# Patient Record
Sex: Male | Born: 1985
Health system: Southern US, Community
[De-identification: ages and names within clinical notes are randomized; demographics above are authoritative.]

## PROBLEM LIST (undated history)

## (undated) DIAGNOSIS — F32A Depression, unspecified: Secondary | ICD-10-CM

## (undated) DIAGNOSIS — F329 Major depressive disorder, single episode, unspecified: Secondary | ICD-10-CM

## (undated) DIAGNOSIS — F419 Anxiety disorder, unspecified: Secondary | ICD-10-CM

## (undated) HISTORY — DX: Anxiety disorder, unspecified: F41.9

## (undated) HISTORY — PX: HAND SURGERY: SHX662

## (undated) HISTORY — DX: Major depressive disorder, single episode, unspecified: F32.9

## (undated) HISTORY — DX: Depression, unspecified: F32.A

---

## 2011-12-29 ENCOUNTER — Encounter (HOSPITAL_COMMUNITY): Payer: Self-pay | Admitting: Emergency Medicine

## 2011-12-29 ENCOUNTER — Emergency Department (HOSPITAL_COMMUNITY)
Admission: EM | Admit: 2011-12-29 | Discharge: 2011-12-29 | Disposition: A | Discharge status: Home / Self Care | Payer: Self-pay | Attending: Emergency Medicine | Admitting: Emergency Medicine

## 2011-12-29 ENCOUNTER — Emergency Department (HOSPITAL_COMMUNITY): Payer: Self-pay

## 2011-12-29 DIAGNOSIS — M542 Cervicalgia: Secondary | ICD-10-CM | POA: Insufficient documentation

## 2011-12-29 DIAGNOSIS — M25519 Pain in unspecified shoulder: Secondary | ICD-10-CM | POA: Insufficient documentation

## 2011-12-29 MED ORDER — HYDROCODONE-ACETAMINOPHEN 5-500 MG PO TABS: 1.0000 | ORAL_TABLET | Freq: Four times a day (QID) | ORAL | Status: AC | PRN

## 2011-12-29 MED ORDER — IBUPROFEN 200 MG PO TABS
600.0000 mg | ORAL_TABLET | Freq: Once | ORAL | Status: AC
  Administered 2011-12-29: 600 mg via ORAL
  Filled 2011-12-29: qty 3

## 2011-12-29 MED ORDER — PREDNISONE 20 MG PO TABS: ORAL_TABLET | ORAL | Status: AC

## 2011-12-29 NOTE — ED Notes (Signed)
Pt c/o left shoulder pain pt denies any injury.  Strong radial pulse present

## 2011-12-29 NOTE — ED Provider Notes (Signed)
History     CSN: [id number]  Arrival date & time 12/29/11  1615   First MD Initiated Contact with Patient 12/29/11 1711      Chief Complaint  Patient presents with  . Shoulder Pain    (Consider location/radiation/quality/duration/timing/severity/associated sxs/prior treatment) Patient is a 26 y.o. male presenting with shoulder pain. The history is provided by the patient.  Shoulder Pain  pt c/o left neck, left trapezius area, and left shoulder pain for the past month. Constant. Worse w movement shoulder and turning neck 'all the way to left'. Occasionally radiates down to left upper arm. No numbness/weakness. No hx ddd. No injury to neck or shoulder. No hx djd, or rotator cuff injury. No fall. 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 No fever or chills.  History reviewed. No pertinent past medical history.  Past Surgical History  Procedure Date  . Hand surgery     No family history on file.  History  Substance Use Topics  . Smoking status: Current Everyday Smoker  . Smokeless tobacco: Not on file  . Alcohol Use: No      Review of Systems  Constitutional: Negative for fever and chills.  Musculoskeletal: Negative for joint swelling.  Neurological: Negative for weakness and numbness.    Allergies  Review of patient's allergies indicates no known allergies.  Home Medications  No current outpatient prescriptions on file.  BP 123/74  Pulse 64  Temp(Src) 98 F (36.7 C) (Oral)  Resp 18  SpO2 99%  Physical Exam  Nursing note and vitals reviewed. Constitutional: He is oriented to person, place, and time. He appears well-developed and well-nourished. No distress.  HENT:  Head: Atraumatic.  Eyes: Pupils are equal, round, and reactive to light.  Neck: Normal range of motion. Neck supple. No tracheal deviation present.  Cardiovascular: Normal rate.   Pulmonary/Chest: Effort normal. No accessory muscle usage. No respiratory  distress.  Musculoskeletal: Normal range of motion. He exhibits no edema.       Spine non tender, aligned, no step off. Left neck and left trap muscule tenderness extending towards left shoulder. No skin changes or erythema. No sts. Good rom at shoulder without pain. Distal pulses palp.   Neurological: He is alert and oriented to person, place, and time.       LUE nvi. Motor intact.   Skin: Skin is warm and dry.  Psychiatric: He has a normal mood and affect.    ED Course  Procedures (including critical care time)  Dg Cervical Spine Complete  12/29/2011  *RADIOLOGY REPORT*  Clinical Data: Left shoulder pain radiating down left arm 1 month, no known injury  CERVICAL SPINE - COMPLETE 4+ VIEW  Comparison: None  Findings: Prevertebral soft tissues normal thickness. Osseous mineralization normal. Vertebral body and disc space heights maintained. Bony neural foramina patent. Lung apices clear. No acute fracture, subluxation or bone destruction. C1-C2 alignment normal.  IMPRESSION: No acute cervical spine abnormalities.  Original Report Authenticated By: Lollie Marrow, M.D.   Dg Shoulder Left  12/29/2011  *RADIOLOGY REPORT*  Clinical Data: Pain in left shoulder radiating down left arm for 1 month  LEFT SHOULDER - 2+ VIEW  Comparison: None  Findings: AC joint alignment normal. Bone mineralization normal. No glenohumeral fracture or dislocation. Visualized ribs intact.  IMPRESSION: Normal exam.  Original Report Authenticated By: Lollie Marrow, M.D.       MDM  Xrays. Motrin po.   Discussed xrays w pt. ?radicular pain/symptoms, ?nerve  impingement. Will rx w pred/vicodin. pcp follow up.       Suzi Roots, MD 12/29/11 Mikle Bosworth

## 2011-12-29 NOTE — ED Notes (Signed)
C/o L shoulder pain x 1 month.  No known injury.  Pain worse with movement.

## 2017-10-20 ENCOUNTER — Ambulatory Visit: Payer: Self-pay | Admitting: Family Medicine

## 2017-10-22 ENCOUNTER — Telehealth: Payer: Self-pay | Admitting: Radiology

## 2017-10-22 ENCOUNTER — Other Ambulatory Visit: Payer: Self-pay

## 2017-10-22 ENCOUNTER — Encounter: Payer: Self-pay | Admitting: Family Medicine

## 2017-10-22 ENCOUNTER — Ambulatory Visit: Payer: 59 | Admitting: Family Medicine

## 2017-10-22 ENCOUNTER — Ambulatory Visit (INDEPENDENT_AMBULATORY_CARE_PROVIDER_SITE_OTHER): Payer: 59

## 2017-10-22 VITALS — BP 120/78 | HR 87 | Temp 98.6°F | Ht 68.9 in | Wt 147.6 lb

## 2017-10-22 DIAGNOSIS — T148XXA Other injury of unspecified body region, initial encounter: Secondary | ICD-10-CM

## 2017-10-22 DIAGNOSIS — G8929 Other chronic pain: Secondary | ICD-10-CM

## 2017-10-22 DIAGNOSIS — G44209 Tension-type headache, unspecified, not intractable: Secondary | ICD-10-CM | POA: Diagnosis not present

## 2017-10-22 DIAGNOSIS — M545 Low back pain: Secondary | ICD-10-CM

## 2017-10-22 DIAGNOSIS — F4323 Adjustment disorder with mixed anxiety and depressed mood: Secondary | ICD-10-CM | POA: Diagnosis not present

## 2017-10-22 DIAGNOSIS — X503XXA Overexertion from repetitive movements, initial encounter: Secondary | ICD-10-CM

## 2017-10-22 MED ORDER — MELOXICAM 15 MG PO TABS: 15.0000 mg | ORAL_TABLET | Freq: Every day | ORAL | 0 refills | Status: DC

## 2017-10-22 MED ORDER — VENLAFAXINE HCL 50 MG PO TABS: 50.0000 mg | ORAL_TABLET | Freq: Two times a day (BID) | ORAL | 3 refills | Status: DC

## 2017-10-22 NOTE — Progress Notes (Signed)
Chief Complaint  Patient presents with  . New Patient (Initial Visit)    establish care    HPI   Pt reports that he has been depressed for as long as he can remember He states that he feels like he is failing in the role of Larry family He states that he feels overwhelmed He reports that he talks to his wife about this He states that this brought his depression to light He states that his wife and mother in law are dealing with depression His mother started to talk to about her suicide attempt  [facility] 10/22/2017  Decreased Interest 3  Down, Depressed, Hopeless 2  PHQ - 2 Score 5  Altered sleeping 3  Tired, decreased energy 3  Change in appetite 0  Feeling bad or failure about yourself  1  Trouble concentrating 3  Moving slowly or fidgety/restless 1  Suicidal thoughts 0  PHQ-9 Score 16  Difficult doing work/chores Somewhat difficult   He denies every thinking about suicide He saw his mother attempt suicide at an age of 31  He reports that he works long hours and it depends on how the day goes He builds scaffolding He reports that by the time he is home he has nothing left. His body is killing him and his mind is done. He is also saving for Larry house.  He denies alcohol use  Back Pain and headaches He reports that he does not get enough sleep due to back pain from working with heavy equipment and lumbar For his back pain he takes ibuprofen or tylenol  He also has migraines and he thinks it is from his partial that is not properly fitted  He takes excedrin HA He states that the excedrin HA helps his migraines His headaches are typically in the back of the headache but if he grinds his teeth the headache will be bitemporal.   Anxiety He reports that he has been feeling like something negative is going to happen. He also just got custody of an 31yo boy and also has Larry 31 year old girl. He reports that at night he thinks about all the things on his plate. His  mind tends to run.  GAD 7 : Generalized Anxiety Score 10/22/2017  Nervous, Anxious, on Edge 1  Control/stop worrying 3  Worry too much - different things 3  Trouble relaxing 3  Restless 1  Easily annoyed or irritable 3  Afraid - awful might happen 1  Total GAD 7 Score 15  Anxiety Difficulty Somewhat difficult       History reviewed. No pertinent past medical history.  Current Outpatient Medications  Medication Sig Dispense Refill  . venlafaxine (EFFEXOR) 50 MG tablet Take 1 tablet (50 mg total) by mouth 2 (two) times daily. 60 tablet 3   No current facility-administered medications for this visit.     Allergies: No Known Allergies  Past Surgical History:  Procedure Laterality Date  . HAND SURGERY    . HAND SURGERY Right    age 89    Social History   Socioeconomic History  . Marital status: Single    Spouse name: None  . Number of children: None  . Years of education: None  . Highest education level: None  Social Needs  . Financial resource strain: None  . Food insecurity - worry: None  . Food insecurity - inability: None  . Transportation needs - medical: None  . Transportation needs - non-medical: None  Occupational History  . None  Tobacco Use  . Smoking status: Former Smoker    Last attempt to quit: 12/02/2016    Years since quitting: 0.8  . Smokeless tobacco: Never Used  Substance and Sexual Activity  . Alcohol use: No  . Drug use: No  . Sexual activity: None  Other Topics Concern  . None  Social History Narrative  . None    History reviewed. No pertinent family history.   ROS Review of Systems See HPI Constitution: No fevers or chills No malaise No diaphoresis Skin: No rash or itching Eyes: no blurry vision, no double vision GU: no dysuria or hematuria Neuro: no dizziness or headaches  all others reviewed and negative   Objective: Vitals:   10/22/17 0857  BP: 120/78  Pulse: 87  Temp: 98.6 F (37 C)  TempSrc: Oral  SpO2: 100%    Weight: 147 lb 9.6 oz (67 kg)  Height: 5' 8.9" (1.75 m)    Physical Exam  Constitutional: He is oriented to person, place, and time. He appears well-developed and well-nourished.  HENT:  Head: Normocephalic and atraumatic.  Eyes: Conjunctivae and EOM are normal.  Cardiovascular: Normal rate, regular rhythm and normal heart sounds.  No murmur heard. Pulmonary/Chest: Effort normal and breath sounds normal. No stridor. No respiratory distress.  Neurological: He is alert and oriented to person, place, and time.  Skin: Skin is warm. Capillary refill takes less than 2 seconds.  Psychiatric: He has Larry normal mood and affect. His behavior is normal. Judgment and thought content normal.  tender over the spinal column in the lumbar region No SI joint tenderness Normal hip exam Normal knee exam Patellar reflex 2+  Lumbar spine xray 10/22/2017 No acute abnormality noted  Assessment and Plan Larry Anderson was seen today for new patient (initial visit).  Diagnoses and all orders for this visit:  Adjustment disorder with mixed anxiety and depressed mood- advised once Larry day venlafaxine for Larry week then increase to bid -     venlafaxine (EFFEXOR) 50 MG tablet; Take 1 tablet (50 mg total) by mouth 2 (two) times daily.  Tension-type headache, not intractable, unspecified chronicity pattern- advised pt to use muscle stretches and see Larry chiropractor  Chronic midline low back pain without sciatica- normal xray Advised meloxicam Overuse injury -     DG Lumbar Spine Complete      Larry Anderson Larry Anderson

## 2017-10-22 NOTE — Patient Instructions (Addendum)
   IF you received an x-ray today, you will receive an invoice from [facility]. Please contact  Radiology at [phone] with questions or concerns regarding your invoice.   IF you received labwork today, you will receive an invoice from [facility]. Please contact [facility] at [phone] with questions or concerns regarding your invoice.   Our billing staff will not be able to assist you with questions regarding bills from these companies.  You will be contacted with the lab results as soon as they are available. The fastest way to get your results is to activate your My Chart account. Instructions are located on the last page of this paperwork. If you have not heard from us regarding the results in 2 weeks, please contact this office.     Adjustment Disorder, Adult Adjustment disorder is a group of symptoms that can develop after a stressful life event, such as the loss of a job or serious physical illness. The symptoms can affect how you feel, think, and act. They may interfere with your relationships. Adjustment disorder increases your risk of suicide and substance abuse. If this disorder is not managed early, it can develop into a more serious condition, such as major depressive disorder or post-traumatic stress disorder. What are the causes? This condition happens when you have trouble recovering from or coping with a stressful life event. What increases the risk? You are more likely to develop this condition if:  You have had depression or anxiety.  You are being treated for a long-term (chronic) illness.  You are being treated for an illness that cannot be cured (terminal illness).  You have a family history of mental illness.  What are the signs or symptoms? Symptoms of this condition include:  Extreme trouble doing daily tasks, such as going to work.  Sadness, depression, or crying spells.  Worrying a lot.  Loss of enjoyment.  Change in appetite  or weight.  Feelings of loss or hopelessness.  Thoughts of suicide.  Anxiety, worry, or nervousness.  Trouble sleeping.  Avoiding family and friends.  Fighting or vandalism.  Complaining of feeling sick without being ill.  Feeling dazed or disconnected.  Nightmares.  Trouble sleeping.  Irritability.  Reckless driving.  Poor work performance.  Ignoring bills.  Symptoms of this condition start within three months of the stressful event. They do not last more than six months, unless the stressful circumstances last longer. Normal grieving after the death of a loved one is not a symptom of this condition. How is this diagnosed? To diagnose this condition, your health care provider will ask about what has happened in your life and how it has affected you. He or she may also ask about your medical history and your use of medicines, alcohol, and other substances. Your health care provider may do a physical exam and order lab tests or other studies. You may be referred to a mental health specialist. How is this treated? Treatment options for this condition include:  Counseling or talk therapy. Talk therapy is usually provided by mental health specialists.  Medicines. Certain medicines may help with depression, anxiety, and sleep.  Support groups. These offer emotional support, advice, and guidance. They are made up of people who have had similar experiences.  Observation and time. This is sometimes called "watchful waiting." In this treatment, health care providers monitor your health and behavior without other treatment. Adjustment disorder sometimes gets better on its own with time.  Follow these instructions at home:  Take   over-the-counter and prescription medicines only as told by your health care provider.  Keep all follow-up visits as told by your health care provider. This is important. Contact a health care provider if:  Your symptoms do not improve in six  months.  Your symptoms get worse. Get help right away if:  You have serious thoughts about hurting yourself or someone else. If you ever feel like you may hurt yourself or others, or have thoughts about taking your own life, get help right away. You can go to your nearest emergency department or call:  Your local emergency services (911 in the [place]  A suicide crisis helpline, such as the National Suicide Prevention Lifeline at 1-800-273-8255. This is open 24 hours a day.  Summary  Adjustment disorder is a group of symptoms that can develop after a stressful life event, such as the loss of a job or serious physical illness. The symptoms can affect how you feel, think, and act. They may interfere with your relationships.  Symptoms of this condition start within three months of the stressful event. They do not last more than six months, unless the stressful circumstances last longer.  Treatment may include talk therapy, medicines, participation in a support group, or observation to see if symptoms improve.  Contact your health care provider if your symptoms get worse or do not improve in six months.  If you ever feel like you may hurt yourself or others, or have thoughts about taking your own life, get help right away. This information is not intended to replace advice given to you by your health care provider. Make sure you discuss any questions you have with your health care provider. Document Released: 07/23/2006 Document Revised: 01/17/2017 Document Reviewed: 01/17/2017 Elsevier Interactive Patient Education  2018 [employer] Inc[employer]  

## 2017-11-19 ENCOUNTER — Ambulatory Visit: Payer: 59 | Admitting: Family Medicine

## 2017-11-26 NOTE — Progress Notes (Signed)
Chief Complaint  Patient presents with  . Anxiety    with depression, started on new medication at last OV. Pt states the first two weeks the medication was working but now he states he thinks it is not working anymore.     HPI   Pt reports that he was taking the venlafaxine at the starting dose He states that he got upset stomach and nausea  That has improved He is now taking 100mg  bid  He reports that it was helping but now it seems to stop helping him as much He states that he initially has anorgasmia that has resolved  Depression screen [facility] 11/27/2017 10/22/2017  Decreased Interest 1 3  Down, Depressed, Hopeless 1 2  PHQ - 2 Score 2 5  Altered sleeping 1 3  Tired, decreased energy 1 3  Change in appetite 0 0  Feeling bad or failure about yourself  1 1  Trouble concentrating 1 3  Moving slowly or fidgety/restless 0 1  Suicidal thoughts 0 0  PHQ-9 Score 6 16  Difficult doing work/chores - Somewhat difficult     4 review of systems  History reviewed. No pertinent past medical history.  Current Outpatient Medications  Medication Sig Dispense Refill  . meloxicam (MOBIC) 15 MG tablet Take 1 tablet (15 mg total) by mouth daily. 30 tablet 0  . venlafaxine (EFFEXOR) 100 MG tablet Take 1 tablet (100 mg total) by mouth 2 (two) times daily. 180 tablet 3   No current facility-administered medications for this visit.     Allergies: No Known Allergies  Past Surgical History:  Procedure Laterality Date  . HAND SURGERY    . HAND SURGERY Right    age 31    Social History   Socioeconomic History  . Marital status: Single    Spouse name: None  . Number of children: None  . Years of education: None  . Highest education level: None  Social Needs  . Financial resource strain: None  . Food insecurity - worry: None  . Food insecurity - inability: None  . Transportation needs - medical: None  . Transportation needs - non-medical: None  Occupational History  . None    Tobacco Use  . Smoking status: Former Smoker    Last attempt to quit: 12/02/2016    Years since quitting: 0.9  . Smokeless tobacco: Never Used  Substance and Sexual Activity  . Alcohol use: No  . Drug use: No  . Sexual activity: None  Other Topics Concern  . None  Social History Narrative  . None    History reviewed. No pertinent family history.   ROS Review of Systems See HPI Constitution: No fevers or chills No malaise No diaphoresis Skin: No rash or itching Eyes: no blurry vision, no double vision GU: no dysuria or hematuria Neuro: no dizziness or headaches all others reviewed and negative   Objective: Vitals:   11/27/17 0812  BP: 122/80  Pulse: (!) 113  Resp: 16  Temp: 98 F (36.7 C)  TempSrc: Oral  SpO2: 100%  Weight: 148 lb (67.1 kg)  Height: 5' 9.29" (1.76 m)    Physical Exam General: alert, oriented, in NAD Head: normocephalic, atraumatic, no sinus tenderness Eyes: EOM intact, no scleral icterus or conjunctival injection Ears: TM clear bilaterally Nose: mucosa nonerythematous, nonedematous Throat: no pharyngeal exudate or erythema Lymph: no posterior auricular, submental or cervical lymph adenopathy Heart: normal rate, normal sinus rhythm, no murmurs Lungs: clear to auscultation bilaterally, no wheezing  Assessment and Plan Baird LyonsCasey was seen today for anxiety.  Diagnoses and all orders for this visit:  Adjustment disorder with mixed anxiety and depressed mood-  discussed that he should increase his dose to 75mg  bid and discussed that he can increase his dose to 100mg  bid Once the dose is stable will just continue with refills Exercise is advised -     Discontinue: venlafaxine (EFFEXOR) 75 MG tablet; Take 1 tablet (75 mg total) by mouth 2 (two) times daily. -     venlafaxine (EFFEXOR) 100 MG tablet; Take 1 tablet (100 mg total) by mouth 2 (two) times daily.      Sheva Mcdougle A Lyberti Thrush

## 2017-11-27 ENCOUNTER — Other Ambulatory Visit: Payer: Self-pay

## 2017-11-27 ENCOUNTER — Ambulatory Visit: Payer: 59 | Admitting: Family Medicine

## 2017-11-27 ENCOUNTER — Encounter: Payer: Self-pay | Admitting: Family Medicine

## 2017-11-27 VITALS — BP 122/80 | HR 113 | Temp 98.0°F | Resp 16 | Ht 69.29 in | Wt 148.0 lb

## 2017-11-27 DIAGNOSIS — F4323 Adjustment disorder with mixed anxiety and depressed mood: Secondary | ICD-10-CM | POA: Diagnosis not present

## 2017-11-27 MED ORDER — VENLAFAXINE HCL 100 MG PO TABS: 100.0000 mg | ORAL_TABLET | Freq: Two times a day (BID) | ORAL | 3 refills | Status: DC

## 2017-11-27 MED ORDER — VENLAFAXINE HCL 75 MG PO TABS: 75.0000 mg | ORAL_TABLET | Freq: Two times a day (BID) | ORAL | 0 refills | Status: DC

## 2017-11-27 NOTE — Patient Instructions (Addendum)
   IF you received an x-ray today, you will receive an invoice from [facility]. Please contact  Radiology at [phone] with questions or concerns regarding your invoice.   IF you received labwork today, you will receive an invoice from [facility]. Please contact [facility] at [phone] with questions or concerns regarding your invoice.   Our billing staff will not be able to assist you with questions regarding bills from these companies.  You will be contacted with the lab results as soon as they are available. The fastest way to get your results is to activate your My Chart account. Instructions are located on the last page of this paperwork. If you have not heard from us regarding the results in 2 weeks, please contact this office.     Adjustment Disorder, Adult Adjustment disorder is a group of symptoms that can develop after a stressful life event, such as the loss of a job or serious physical illness. The symptoms can affect how you feel, think, and act. They may interfere with your relationships. Adjustment disorder increases your risk of suicide and substance abuse. If this disorder is not managed early, it can develop into a more serious condition, such as major depressive disorder or post-traumatic stress disorder. What are the causes? This condition happens when you have trouble recovering from or coping with a stressful life event. What increases the risk? You are more likely to develop this condition if:  You have had depression or anxiety.  You are being treated for a long-term (chronic) illness.  You are being treated for an illness that cannot be cured (terminal illness).  You have a family history of mental illness.  What are the signs or symptoms? Symptoms of this condition include:  Extreme trouble doing daily tasks, such as going to work.  Sadness, depression, or crying spells.  Worrying a lot.  Loss of enjoyment.  Change in appetite  or weight.  Feelings of loss or hopelessness.  Thoughts of suicide.  Anxiety, worry, or nervousness.  Trouble sleeping.  Avoiding family and friends.  Fighting or vandalism.  Complaining of feeling sick without being ill.  Feeling dazed or disconnected.  Nightmares.  Trouble sleeping.  Irritability.  Reckless driving.  Poor work performance.  Ignoring bills.  Symptoms of this condition start within three months of the stressful event. They do not last more than six months, unless the stressful circumstances last longer. Normal grieving after the death of a loved one is not a symptom of this condition. How is this diagnosed? To diagnose this condition, your health care provider will ask about what has happened in your life and how it has affected you. He or she may also ask about your medical history and your use of medicines, alcohol, and other substances. Your health care provider may do a physical exam and order lab tests or other studies. You may be referred to a mental health specialist. How is this treated? Treatment options for this condition include:  Counseling or talk therapy. Talk therapy is usually provided by mental health specialists.  Medicines. Certain medicines may help with depression, anxiety, and sleep.  Support groups. These offer emotional support, advice, and guidance. They are made up of people who have had similar experiences.  Observation and time. This is sometimes called "watchful waiting." In this treatment, health care providers monitor your health and behavior without other treatment. Adjustment disorder sometimes gets better on its own with time.  Follow these instructions at home:  Take   over-the-counter and prescription medicines only as told by your health care provider.  Keep all follow-up visits as told by your health care provider. This is important. Contact a health care provider if:  Your symptoms do not improve in six  months.  Your symptoms get worse. Get help right away if:  You have serious thoughts about hurting yourself or someone else. If you ever feel like you may hurt yourself or others, or have thoughts about taking your own life, get help right away. You can go to your nearest emergency department or call:  Your local emergency services (911 in the [place]  A suicide crisis helpline, such as the National Suicide Prevention Lifeline at 1-800-273-8255. This is open 24 hours a day.  Summary  Adjustment disorder is a group of symptoms that can develop after a stressful life event, such as the loss of a job or serious physical illness. The symptoms can affect how you feel, think, and act. They may interfere with your relationships.  Symptoms of this condition start within three months of the stressful event. They do not last more than six months, unless the stressful circumstances last longer.  Treatment may include talk therapy, medicines, participation in a support group, or observation to see if symptoms improve.  Contact your health care provider if your symptoms get worse or do not improve in six months.  If you ever feel like you may hurt yourself or others, or have thoughts about taking your own life, get help right away. This information is not intended to replace advice given to you by your health care provider. Make sure you discuss any questions you have with your health care provider. Document Released: 07/23/2006 Document Revised: 01/17/2017 Document Reviewed: 01/17/2017 Elsevier Interactive Patient Education  2018 [employer]  

## 2018-06-01 ENCOUNTER — Ambulatory Visit: Payer: 59 | Admitting: Family Medicine

## 2018-06-01 NOTE — Progress Notes (Deleted)
  No chief complaint on file.   HPI  4 review of systems  No past medical history on file.  Current Outpatient Medications  Medication Sig Dispense Refill  . meloxicam (MOBIC) 15 MG tablet Take 1 tablet (15 mg total) by mouth daily. 30 tablet 0  . venlafaxine (EFFEXOR) 100 MG tablet Take 1 tablet (100 mg total) by mouth 2 (two) times daily. 180 tablet 3   No current facility-administered medications for this visit.     Allergies: No Known Allergies  Past Surgical History:  Procedure Laterality Date  . HAND SURGERY    . HAND SURGERY Right    age 32    Social History   Socioeconomic History  . Marital status: Single    Spouse name: Not on file  . Number of children: Not on file  . Years of education: Not on file  . Highest education level: Not on file  Occupational History  . Not on file  Social Needs  . Financial resource strain: Not on file  . Food insecurity:    Worry: Not on file    Inability: Not on file  . Transportation needs:    Medical: Not on file    Non-medical: Not on file  Tobacco Use  . Smoking status: Former Smoker    Last attempt to quit: 12/02/2016    Years since quitting: 1.4  . Smokeless tobacco: Never Used  Substance and Sexual Activity  . Alcohol use: No  . Drug use: No  . Sexual activity: Not on file  Lifestyle  . Physical activity:    Days per week: Not on file    Minutes per session: Not on file  . Stress: Not on file  Relationships  . Social connections:    Talks on phone: Not on file    Gets together: Not on file    Attends religious service: Not on file    Active member of club or organization: Not on file    Attends meetings of clubs or organizations: Not on file    Relationship status: Not on file  Other Topics Concern  . Not on file  Social History Narrative  . Not on file    No family history on file.   ROS Review of Systems See HPI Constitution: No fevers or chills No malaise No diaphoresis Skin: No rash or  itching Eyes: no blurry vision, no double vision GU: no dysuria or hematuria Neuro: no dizziness or headaches * all others reviewed and negative   Objective: There were no vitals filed for this visit.  Physical Exam  Assessment and Plan There are no diagnoses linked to this encounter.   Larry Anderson P [employer]

## 2018-06-09 ENCOUNTER — Ambulatory Visit: Payer: 59 | Admitting: Family Medicine

## 2018-06-09 ENCOUNTER — Encounter: Payer: Self-pay | Admitting: Family Medicine

## 2018-06-09 ENCOUNTER — Other Ambulatory Visit: Payer: Self-pay

## 2018-06-09 VITALS — BP 110/72 | HR 65 | Temp 98.0°F | Resp 16 | Ht 69.29 in | Wt 160.0 lb

## 2018-06-09 DIAGNOSIS — F419 Anxiety disorder, unspecified: Secondary | ICD-10-CM

## 2018-06-09 DIAGNOSIS — F32A Depression, unspecified: Secondary | ICD-10-CM

## 2018-06-09 DIAGNOSIS — F329 Major depressive disorder, single episode, unspecified: Secondary | ICD-10-CM

## 2018-06-09 MED ORDER — VENLAFAXINE HCL ER 75 MG PO CP24: 75.0000 mg | ORAL_CAPSULE | Freq: Two times a day (BID) | ORAL | 0 refills | Status: DC

## 2018-06-09 MED ORDER — HYDROXYZINE HCL 50 MG PO TABS: 50.0000 mg | ORAL_TABLET | Freq: Every evening | ORAL | 11 refills | Status: DC | PRN

## 2018-06-09 NOTE — Progress Notes (Signed)
Chief Complaint  Patient presents with  . Depression    6 month follow-up, pt states things had been going bad for the last few months would like to talk about possibly changing dose of medication     HPI   Pt reports that he has been going through a lot of stress in addition to a break in in front of his shouse  The burgular tried to break in then died outside of their house States that he has been going through it He states that the Effexor was previously work and now is not  Effexor was causing him to get sweats especially in the summer He states that he was feeling like he was getting dehydrated from all the sweating at 100mg  of Effexor He reports that he would like to get 75 mg  [facility] 06/09/2018 11/27/2017 10/22/2017  Decreased Interest 2 1 3   Down, Depressed, Hopeless 2 1 2   PHQ - 2 Score 4 2 5   Altered sleeping 2 1 3   Tired, decreased energy 2 1 3   Change in appetite 2 0 0  Feeling bad or failure about yourself  2 1 1   Trouble concentrating 0 1 3  Moving slowly or fidgety/restless 0 0 1  Suicidal thoughts 0 0 0  PHQ-9 Score 12 6 16   Difficult doing work/chores - - Somewhat difficult     History reviewed. No pertinent past medical history.  Current Outpatient Medications  Medication Sig Dispense Refill  . hydrOXYzine (ATARAX/VISTARIL) 50 MG tablet Take 1 tablet (50 mg total) by mouth at bedtime as needed. 30 tablet 11  . venlafaxine XR (EFFEXOR XR) 75 MG 24 hr capsule Take 1 capsule (75 mg total) by mouth 2 (two) times daily. 60 capsule 0   No current facility-administered medications for this visit.     Allergies: No Known Allergies  Past Surgical History:  Procedure Laterality Date  . HAND SURGERY    . HAND SURGERY Right    age 32    Social History   Socioeconomic History  . Marital status: Single    Spouse name: Not on file  . Number of children: Not on file  . Years of education: Not on file  . Highest education level: Not on  file  Occupational History  . Not on file  Social Needs  . Financial resource strain: Not on file  . Food insecurity:    Worry: Not on file    Inability: Not on file  . Transportation needs:    Medical: Not on file    Non-medical: Not on file  Tobacco Use  . Smoking status: Former Smoker    Last attempt to quit: 12/02/2016    Years since quitting: 1.5  . Smokeless tobacco: Never Used  Substance and Sexual Activity  . Alcohol use: No  . Drug use: No  . Sexual activity: Not on file  Lifestyle  . Physical activity:    Days per week: Not on file    Minutes per session: Not on file  . Stress: Not on file  Relationships  . Social connections:    Talks on phone: Not on file    Gets together: Not on file    Attends religious service: Not on file    Active member of club or organization: Not on file    Attends meetings of clubs or organizations: Not on file    Relationship status: Not on file  Other Topics Concern  . Not on  file  Social History Narrative  . Not on file    History reviewed. No pertinent family history.   ROS Review of Systems See HPI Constitution: No fevers or chills No malaise No diaphoresis Skin: No rash or itching Eyes: no blurry vision, no double vision GU: no dysuria or hematuria Neuro: no dizziness or headaches all others reviewed and negative   Objective: Vitals:   06/09/18 1141  BP: 110/72  Pulse: 65  Resp: 16  Temp: 98 F (36.7 C)  TempSrc: Oral  SpO2: 100%  Weight: 160 lb (72.6 kg)  Height: 5' 9.29" (1.76 m)    Physical Exam General: alert, oriented, in NAD Head: normocephalic, atraumatic, no sinus tenderness Eyes: EOM intact, no scleral icterus or conjunctival injection Ears: TM clear bilaterally Nose: mucosa nonerythematous, nonedematous Throat: no pharyngeal exudate or erythema Lymph: no posterior auricular, submental or cervical lymph adenopathy Heart: normal rate, normal sinus rhythm, no murmurs Lungs: clear to  auscultation bilaterally, no wheezing   Assessment and Plan Larry Anderson was seen today for depression.  Diagnoses and all orders for this visit:  Anxiety and depression  Other orders -     Discontinue: venlafaxine XR (EFFEXOR XR) 75 MG 24 hr capsule; Take 1 capsule (75 mg total) by mouth 2 (two) times daily. -     hydrOXYzine (ATARAX/VISTARIL) 50 MG tablet; Take 1 tablet (50 mg total) by mouth at bedtime as needed. -     venlafaxine XR (EFFEXOR XR) 75 MG 24 hr capsule; Take 1 capsule (75 mg total) by mouth 2 (two) times daily.   Discussed dosage Advised follow up by phone if dose is working 3 months follow up to check in on mood and counseling    Larry Anderson

## 2018-06-09 NOTE — Patient Instructions (Addendum)
   IF you received an x-ray today, you will receive an invoice from [facility]. Please contact [facility] at [phone] with questions or concerns regarding your invoice.   IF you received labwork today, you will receive an invoice from [facility]. Please contact [facility] at [phone] with questions or concerns regarding your invoice.   Our billing staff will not be able to assist you with questions regarding bills from these companies.  You will be contacted with the lab results as soon as they are available. The fastest way to get your results is to activate your My Chart account. Instructions are located on the last page of this paperwork. If you have not heard from us regarding the results in 2 weeks, please contact this office.     Generalized Anxiety Disorder, Adult Generalized anxiety disorder (GAD) is a mental health disorder. People with this condition constantly worry about everyday events. Unlike normal anxiety, worry related to GAD is not triggered by a specific event. These worries also do not fade or get better with time. GAD interferes with life functions, including relationships, work, and school. GAD can vary from mild to severe. People with severe GAD can have intense waves of anxiety with physical symptoms (panic attacks). What are the causes? The exact cause of GAD is not known. What increases the risk? This condition is more likely to develop in:  Women.  People who have a family history of anxiety disorders.  People who are very shy.  People who experience very stressful life events, such as the death of a loved one.  People who have a very stressful family environment.  What are the signs or symptoms? People with GAD often worry excessively about many things in their lives, such as their health and family. They may also be overly concerned about:  Doing well at work.  Being on time.  Natural disasters.  Friendships.  Physical  symptoms of GAD include:  Fatigue.  Muscle tension or having muscle twitches.  Trembling or feeling shaky.  Being easily startled.  Feeling like your heart is pounding or racing.  Feeling out of breath or like you cannot take a deep breath.  Having trouble falling asleep or staying asleep.  Sweating.  Nausea, diarrhea, or irritable bowel syndrome (IBS).  Headaches.  Trouble concentrating or remembering facts.  Restlessness.  Irritability.  How is this diagnosed? Your health care provider can diagnose GAD based on your symptoms and medical history. You will also have a physical exam. The health care provider will ask specific questions about your symptoms, including how severe they are, when they started, and if they come and go. Your health care provider may ask you about your use of alcohol or drugs, including prescription medicines. Your health care provider may refer you to a mental health specialist for further evaluation. Your health care provider will do a thorough examination and may perform additional tests to rule out other possible causes of your symptoms. To be diagnosed with GAD, a person must have anxiety that:  Is out of his or her control.  Affects several different aspects of his or her life, such as work and relationships.  Causes distress that makes him or her unable to take part in normal activities.  Includes at least three physical symptoms of GAD, such as restlessness, fatigue, trouble concentrating, irritability, muscle tension, or sleep problems.  Before your health care provider can confirm a diagnosis of GAD, these symptoms must be present more days than   they are not, and they must last for six months or longer. How is this treated? The following therapies are usually used to treat GAD:  Medicine. Antidepressant medicine is usually prescribed for long-term daily control. Antianxiety medicines may be added in severe cases, especially when panic  attacks occur.  Talk therapy (psychotherapy). Certain types of talk therapy can be helpful in treating GAD by providing support, education, and guidance. Options include: ? Cognitive behavioral therapy (CBT). People learn coping skills and techniques to ease their anxiety. They learn to identify unrealistic or negative thoughts and behaviors and to replace them with positive ones. ? Acceptance and commitment therapy (ACT). This treatment teaches people how to be mindful as a way to cope with unwanted thoughts and feelings. ? Biofeedback. This process trains you to manage your body's response (physiological response) through breathing techniques and relaxation methods. You will work with a therapist while machines are used to monitor your physical symptoms.  Stress management techniques. These include yoga, meditation, and exercise.  A mental health specialist can help determine which treatment is best for you. Some people see improvement with one type of therapy. However, other people require a combination of therapies. Follow these instructions at home:  Take over-the-counter and prescription medicines only as told by your health care provider.  Try to maintain a normal routine.  Try to anticipate stressful situations and allow extra time to manage them.  Practice any stress management or self-calming techniques as taught by your health care provider.  Do not punish yourself for setbacks or for not making progress.  Try to recognize your accomplishments, even if they are small.  Keep all follow-up visits as told by your health care provider. This is important. Contact a health care provider if:  Your symptoms do not get better.  Your symptoms get worse.  You have signs of depression, such as: ? A persistently sad, cranky, or irritable mood. ? Loss of enjoyment in activities that used to bring you joy. ? Change in weight or eating. ? Changes in sleeping habits. ? Avoiding friends  or family members. ? Loss of energy for normal tasks. ? Feelings of guilt or worthlessness. Get help right away if:  You have serious thoughts about hurting yourself or others. If you ever feel like you may hurt yourself or others, or have thoughts about taking your own life, get help right away. You can go to your nearest emergency department or call:  Your local emergency services (911 in the [place]  A suicide crisis helpline, such as the National Suicide Prevention Lifeline at 1-800-273-8255. This is open 24 hours a day.  Summary  Generalized anxiety disorder (GAD) is a mental health disorder that involves worry that is not triggered by a specific event.  People with GAD often worry excessively about many things in their lives, such as their health and family.  GAD may cause physical symptoms such as restlessness, trouble concentrating, sleep problems, frequent sweating, nausea, diarrhea, headaches, and trembling or muscle twitching.  A mental health specialist can help determine which treatment is best for you. Some people see improvement with one type of therapy. However, other people require a combination of therapies. This information is not intended to replace advice given to you by your health care provider. Make sure you discuss any questions you have with your health care provider. Document Released: 03/15/2013 Document Revised: 10/08/2016 Document Reviewed: 10/08/2016 Elsevier Interactive Patient Education  2018 [employer]  

## 2018-07-07 ENCOUNTER — Other Ambulatory Visit: Payer: Self-pay | Admitting: Family Medicine

## 2018-07-16 ENCOUNTER — Telehealth: Payer: Self-pay | Admitting: Family Medicine

## 2018-07-16 NOTE — Telephone Encounter (Signed)
Left a voicemail in regards to his appt he has with Dr. Creta LevinStallings on 09/09/2018. The provider will be out of the office and need to be rescheduled.

## 2018-09-09 ENCOUNTER — Ambulatory Visit: Payer: 59 | Admitting: Family Medicine

## 2018-09-14 ENCOUNTER — Other Ambulatory Visit: Payer: Self-pay | Admitting: Family Medicine

## 2018-09-15 NOTE — Telephone Encounter (Signed)
Requested medication (s) are due for refill today: yes  Requested medication (s) are on the active medication list: yes  Last refill:  07/07/18  Future visit scheduled: no  Notes to clinic:  No labs noted in chart.  Requested Prescriptions  Pending Prescriptions Disp Refills   venlafaxine XR (EFFEXOR-XR) 75 MG 24 hr capsule [Pharmacy Med Name: VENLAFAXINE ER 75MG  CAP] 60 capsule 0    Sig: TAKE 1 CAPSULE BY MOUTH TWICE DAILY     Psychiatry: Antidepressants - SNRI - desvenlafaxine & venlafaxine Failed - 09/14/2018  9:38 AM      Failed - LDL in normal range and within 360 days    No results found for: LDLCALC, LDLC, HIRISKLDL       Failed - Total Cholesterol in normal range and within 360 days    No results found for: CHOL, POCCHOL       Failed - Triglycerides in normal range and within 360 days    No results found for: TRIG       Passed - Last BP in normal range    BP Readings from Last 1 Encounters:  06/09/18 110/72         Passed - Valid encounter within last 6 months    Recent Outpatient Visits          3 months ago Anxiety and depression   [facility] Care at [facility], Zoe A, MD   9 months ago Adjustment disorder with mixed anxiety and depressed mood   Primary Care at [facility], Zoe A, MD   10 months ago Adjustment disorder with mixed anxiety and depressed mood   Primary Care at [facility], Manus Rudd, MD

## 2018-10-14 ENCOUNTER — Other Ambulatory Visit: Payer: Self-pay | Admitting: Family Medicine

## 2018-10-15 NOTE — Telephone Encounter (Signed)
Requested Prescriptions  Pending Prescriptions Disp Refills  . venlafaxine XR (EFFEXOR-XR) 75 MG 24 hr capsule [Pharmacy Med Name: VENLAFAXINE ER 75MG  CAP] 60 capsule 0    Sig: TAKE 1 CAPSULE BY MOUTH TWICE DAILY     Psychiatry: Antidepressants - SNRI - desvenlafaxine & venlafaxine Failed - 10/14/2018  5:36 PM      Failed - LDL in normal range and within 360 days    No results found for: LDLCALC, LDLC, HIRISKLDL       Failed - Total Cholesterol in normal range and within 360 days    No results found for: CHOL, POCCHOL       Failed - Triglycerides in normal range and within 360 days    No results found for: TRIG       Passed - Last BP in normal range    BP Readings from Last 1 Encounters:  06/09/18 110/72         Passed - Valid encounter within last 6 months    Recent Outpatient Visits          4 months ago Anxiety and depression   [facility] A, MD   10 months ago Adjustment disorder with mixed anxiety and depressed mood   [facility], [facility]   11 months ago Adjustment disorder with mixed anxiety and depressed mood   [facility], Manus RuddZoe A, MD      Future Appointments            In 1 week Doristine BosworthStallings, [place] A, [facility]

## 2018-10-23 ENCOUNTER — Ambulatory Visit: Payer: 59 | Admitting: Family Medicine

## 2018-10-23 ENCOUNTER — Other Ambulatory Visit: Payer: Self-pay

## 2018-10-23 ENCOUNTER — Encounter: Payer: Self-pay | Admitting: Family Medicine

## 2018-10-23 VITALS — BP 126/75 | HR 60 | Temp 98.7°F | Resp 20 | Ht 68.58 in | Wt 153.8 lb

## 2018-10-23 DIAGNOSIS — F32A Depression, unspecified: Secondary | ICD-10-CM

## 2018-10-23 DIAGNOSIS — F17200 Nicotine dependence, unspecified, uncomplicated: Secondary | ICD-10-CM | POA: Diagnosis not present

## 2018-10-23 DIAGNOSIS — F419 Anxiety disorder, unspecified: Secondary | ICD-10-CM | POA: Diagnosis not present

## 2018-10-23 DIAGNOSIS — F430 Acute stress reaction: Secondary | ICD-10-CM

## 2018-10-23 DIAGNOSIS — F410 Panic disorder [episodic paroxysmal anxiety] without agoraphobia: Secondary | ICD-10-CM | POA: Diagnosis not present

## 2018-10-23 DIAGNOSIS — F322 Major depressive disorder, single episode, severe without psychotic features: Secondary | ICD-10-CM | POA: Diagnosis not present

## 2018-10-23 DIAGNOSIS — F329 Major depressive disorder, single episode, unspecified: Secondary | ICD-10-CM

## 2018-10-23 DIAGNOSIS — G44209 Tension-type headache, unspecified, not intractable: Secondary | ICD-10-CM

## 2018-10-23 MED ORDER — DICLOFENAC SODIUM 1 % TD GEL: 2.0000 g | Freq: Four times a day (QID) | TRANSDERMAL | 0 refills | Status: DC

## 2018-10-23 MED ORDER — BUSPIRONE HCL 5 MG PO TABS: 5.0000 mg | ORAL_TABLET | Freq: Three times a day (TID) | ORAL | 0 refills | Status: DC

## 2018-10-23 MED ORDER — VENLAFAXINE HCL ER 150 MG PO CP24: 150.0000 mg | ORAL_CAPSULE | Freq: Every day | ORAL | 0 refills | Status: DC

## 2018-10-23 NOTE — Patient Instructions (Addendum)
If you have lab work done today you will be contacted with your lab results within the next 2 weeks.  If you have not heard from us then please contact us. The fastest way to get your results is to register for My Chart.   IF you received an x-ray today, you will receive an invoice from [facility]. Please contact [facility] at [phone] with questions or concerns regarding your invoice.   IF you received labwork today, you will receive an invoice from [facility]. Please contact [facility] at [phone] with questions or concerns regarding your invoice.   Our billing staff will not be able to assist you with questions regarding bills from these companies.  You will be contacted with the lab results as soon as they are available. The fastest way to get your results is to activate your My Chart account. Instructions are located on the last page of this paperwork. If you have not heard from us regarding the results in 2 weeks, please contact this office.    Panic Attack A panic attack is a sudden episode of severe anxiety, fear, or discomfort that causes physical and emotional symptoms. The attack may be in response to something frightening, or it may occur for no known reason. Symptoms of a panic attack can be similar to symptoms of a heart attack or stroke. It is important to see your health care provider when you have a panic attack so that these conditions can be ruled out. A panic attack is a symptom of another condition. Most panic attacks go away with treatment of the underlying problem. If you have panic attacks often, you may have a condition called panic disorder. What are the causes? A panic attack may be caused by:  An extreme, life-threatening situation, such as a war or natural disaster.  An anxiety disorder, such as post-traumatic stress disorder.  Depression.  Certain medical conditions, including heart problems, neurological conditions, and  infections.  Certain over-the-counter and prescription medicines.  Illegal drugs that increase heart rate and blood pressure, such as methamphetamine.  Alcohol.  Supplements that increase anxiety.  Panic disorder.  What increases the risk? You are more likely to develop this condition if:  You have an anxiety disorder.  You have another mental health condition.  You take certain medicines.  You use alcohol, illegal drugs, or other substances.  You are under extreme stress.  A life event is causing increased feelings of anxiety and depression.  What are the signs or symptoms? A panic attack starts suddenly, usually lasts about 20 minutes, and occurs with one or more of the following:  A pounding heart.  A feeling that your heart is beating irregularly or faster than normal (palpitations).  Sweating.  Trembling or shaking.  Shortness of breath or feeling smothered.  Feeling choked.  Chest pain or discomfort.  Nausea or a strange feeling in your stomach.  Dizziness, feeling lightheaded, or feeling like you might faint.  Chills or hot flashes.  Numbness or tingling in your lips, hands, or feet.  Feeling confused, or feeling that you are not yourself.  Fear of losing control or being emotionally unstable.  Fear of dying.  How is this diagnosed? A panic attack is diagnosed with an assessment by your health care provider. During the assessment your health care provider will ask questions about:  Your history of anxiety, depression, and panic attacks.  Your medical history.  Whether you drink alcohol, use illegal drugs, take supplements,  or take medicines. Be honest about your substance use.  Your health care provider may also:  Order blood tests or other kinds of tests to rule out serious medical conditions.  Refer you to a mental health professional for further evaluation.  How is this treated? Treatment depends on the cause of the panic  attack:  If the cause is a medical problem, your health care provider will either treat that problem or refer you to a specialist.  If the cause is emotional, you may be given anti-anxiety medicines or referred to a counselor. These medicines may reduce how often attacks happen, reduce how severe the attacks are, and lower anxiety.  If the cause is a medicine, your health care provider may tell you to stop the medicine, change your dose, or take a different medicine.  If the cause is a drug, treatment may involve letting the drug wear off and taking medicine to help the drug leave your body or to counteract its effects. Attacks caused by drug abuse may continue even if you stop using the drug.  Follow these instructions at home:  Take over-the-counter and prescription medicines only as told by your health care provider.  If you feel anxious, limit your caffeine intake.  Take good care of your physical and mental health by: ? Eating a balanced diet that includes plenty of fresh fruits and vegetables, whole grains, lean meats, and low-fat dairy. ? Getting plenty of rest. Try to get 7-8 hours of uninterrupted sleep each night. ? Exercising regularly. Try to get 30 minutes of physical activity at least 5 days a week. ? Not smoking. Talk to your health care provider if you need help quitting. ? Limiting alcohol intake to no more than 1 drink a day for nonpregnant women and 2 drinks a day for men. One drink equals 12 oz of beer, 5 oz of wine, or 1 oz of hard liquor.  Keep all follow-up visits as told by your health care provider. This is important. Panic attacks may have underlying physical or emotional problems that take time to accurately diagnose. Contact a health care provider if:  Your symptoms do not improve, or they get worse.  You are not able to take your medicine as prescribed because of side effects. Get help right away if:  You have serious thoughts about hurting yourself or  others.  You have symptoms of a panic attack. Do not drive yourself to the hospital. Have someone else drive you or call an ambulance. If you ever feel like you may hurt yourself or others, or you have thoughts about taking your own life, get help right away. You can go to your nearest emergency department or call:  Your local emergency services (911 in the [place]  A suicide crisis helpline, such as the National Suicide Prevention Lifeline at 2157833275. This is open 24 hours a day.  Summary  A panic attack is a sign of a serious health or mental health condition. Get help right away. Do not drive yourself to the hospital. Have someone else drive you or call an ambulance.  Always see a health care provider to have the reasons for the panic attack correctly diagnosed.  If your panic attack was caused by a physical problem, follow your health care provider's suggestions for medicine, referral to a specialist, and lifestyle changes.  If your panic attack was caused by an emotional problem, follow through with counseling from a qualified mental health specialist.  If you feel like  you may hurt yourself or others, call 911 and get help right away. This information is not intended to replace advice given to you by your health care provider. Make sure you discuss any questions you have with your health care provider. Document Released: 11/18/2005 Document Revised: 12/27/2016 Document Reviewed: 12/27/2016 Elsevier Interactive Patient Education  [employer].
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
## 2018-10-27 ENCOUNTER — Other Ambulatory Visit: Payer: Self-pay | Admitting: Family Medicine

## 2018-11-03 ENCOUNTER — Ambulatory Visit: Payer: Self-pay
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
## 2018-11-04 ENCOUNTER — Ambulatory Visit: Payer: Self-pay | Admitting: Emergency Medicine

## 2018-11-23 ENCOUNTER — Other Ambulatory Visit: Payer: Self-pay | Admitting: Family Medicine

## 2018-11-23 ENCOUNTER — Encounter: Payer: Self-pay | Admitting: Family Medicine

## 2018-12-03 DIAGNOSIS — F17200 Nicotine dependence, unspecified, uncomplicated: Secondary | ICD-10-CM | POA: Diagnosis not present

## 2018-12-25 ENCOUNTER — Ambulatory Visit: Payer: 59 | Admitting: Family Medicine

## 2019-01-09 ENCOUNTER — Other Ambulatory Visit: Payer: Self-pay | Admitting: Family Medicine

## 2019-01-10 ENCOUNTER — Other Ambulatory Visit: Payer: Self-pay | Admitting: Family Medicine

## 2019-01-11 ENCOUNTER — Encounter: Payer: Self-pay | Admitting: Family Medicine

## 2019-01-11 NOTE — Telephone Encounter (Signed)
Please review for refill for venlafaxine XR 75 mg.  Need clarification that patient is taking both venlafaxine XR 75 mg bid and 150 mg daily. Provider Dr. Creta Levin. LOV  10/23/18. The last scheduled appoints were no show or canceled.

## 2019-01-12 NOTE — Telephone Encounter (Signed)
Please advise on refill request below and medication question below

## 2019-01-14 ENCOUNTER — Other Ambulatory Visit: Payer: Self-pay

## 2019-01-14 DIAGNOSIS — F32A Depression, unspecified: Secondary | ICD-10-CM

## 2019-01-14 DIAGNOSIS — F322 Major depressive disorder, single episode, severe without psychotic features: Secondary | ICD-10-CM

## 2019-01-14 MED ORDER — VENLAFAXINE HCL ER 75 MG PO CP24: 75.0000 mg | ORAL_CAPSULE | Freq: Two times a day (BID) | ORAL | 0 refills | Status: DC

## 2019-01-25 DIAGNOSIS — Z0271 Encounter for disability determination: Secondary | ICD-10-CM

## 2019-02-18 ENCOUNTER — Ambulatory Visit: Payer: Self-pay | Admitting: Family Medicine

## 2019-02-18 ENCOUNTER — Encounter: Payer: Self-pay | Admitting: Family Medicine

## 2019-02-18 ENCOUNTER — Other Ambulatory Visit: Payer: Self-pay

## 2019-02-18 VITALS — BP 118/72 | HR 79 | Temp 97.7°F | Resp 18 | Ht 68.78 in | Wt 166.0 lb

## 2019-02-18 DIAGNOSIS — F410 Panic disorder [episodic paroxysmal anxiety] without agoraphobia: Secondary | ICD-10-CM

## 2019-02-18 DIAGNOSIS — F430 Acute stress reaction: Secondary | ICD-10-CM

## 2019-02-18 DIAGNOSIS — F32A Depression, unspecified: Secondary | ICD-10-CM

## 2019-02-18 DIAGNOSIS — F322 Major depressive disorder, single episode, severe without psychotic features: Secondary | ICD-10-CM

## 2019-02-18 DIAGNOSIS — F319 Bipolar disorder, unspecified: Secondary | ICD-10-CM

## 2019-02-18 MED ORDER — VENLAFAXINE HCL ER 75 MG PO CP24: 75.0000 mg | ORAL_CAPSULE | Freq: Two times a day (BID) | ORAL | 11 refills | Status: DC

## 2019-02-18 NOTE — Patient Instructions (Addendum)
If you have lab work done today you will be contacted with your lab results within the next 2 weeks.  If you have not heard from us then please contact us. The fastest way to get your results is to register for My Chart.   IF you received an x-ray today, you will receive an invoice from [facility]. Please contact [facility] at [phone] with questions or concerns regarding your invoice.   IF you received labwork today, you will receive an invoice from [facility]. Please contact [facility] at [phone] with questions or concerns regarding your invoice.   Our billing staff will not be able to assist you with questions regarding bills from these companies.  You will be contacted with the lab results as soon as they are available. The fastest way to get your results is to activate your My Chart account. Instructions are located on the last page of this paperwork. If you have not heard from us regarding the results in 2 weeks, please contact this office.     Living With Bipolar Disorder If you have been diagnosed with bipolar disorder, you may be relieved that you now know why you have felt or behaved a certain way. You may also feel overwhelmed about the treatment ahead, how to get the support you need, and how to deal with the condition day-to-day. With care and support, you can learn to manage your symptoms and live with bipolar disorder. How to manage lifestyle changes Managing stress Stress is your body's reaction to life changes and events, both good and bad. Stress can play a major role in bipolar disorder, so it is important to learn how to cope with stress. Some techniques to cope with stress include:  Meditation, muscle relaxation, and breathing exercises.  Exercise. Even a short daily walk can help to lower stress levels.  Getting enough good-quality sleep. Too little sleep can cause mania to start (can trigger mania).  Making a schedule to manage your  time. Knowing your daily schedule can help to keep you from feeling overwhelmed by tasks and deadlines.  Spending time on hobbies that you enjoy.  Medicines Your health care provider may suggest certain medicines if he or she feels that they will help improve your condition. Avoid using caffeine, alcohol, and other substances that may prevent your medicines from working properly (may interact). It is also important to:  Talk with your pharmacist or health care provider about all the medicines that you take, their possible side effects, and which medicines are safe to take together.  Make it your goal to take part in all treatment decisions (shared decision-making). Ask about possible side effects of medicines that your health care provider recommends, and tell him or her how you feel about having those side effects. It is best if shared decision-making with your health care provider is part of your total treatment plan. If you are taking medicines as part of your treatment, do not stop taking medicines before you ask your health care provider if it is safe to stop. You may need to have the medicine slowly decreased (tapered) over time to decrease the risk of harmful side effects. Relationships Spend time with people that you trust and with whom you feel a sense of understanding and calm. Try to find friends or family members who make you feel safe and can help you control feelings of mania. Consider going to couples counseling, family education classes, or family therapy to:  Educate your  loved ones about your condition and offer suggestions about how they can support you.  Help resolve conflicts.  Help develop communication skills in your relationships.  How to recognize changes in your condition Everyone responds differently to treatment for bipolar disorder. Some signs that your condition is improving include:  Leveling of your mood. You may have less anger and excitement about daily  activities, and your low moods may not be as bad.  Your symptoms being less intense.  Feeling calm more often.  Thinking clearly.  Not experiencing consequences for extreme behavior.  Feeling like your life is settling down.  Your behavior seeming more normal to you and to other people. Some signs that your condition may be getting worse include:  Sleep problems.  Moods cycling between deep lows and unusually high (excess) energy.  Extreme emotions.  More anger at loved ones.  Staying away from others (isolating yourself).  A feeling of power or superiority.  Completing a lot of tasks in a very short amount of time.  Unusual thoughts and behaviors.  Suicidal thoughts. Where to find support Talking to others  Try making a list of the people you may want to tell about your condition, such as the people you trust most.  Plan what you are willing to talk about and what you do not want to discuss. Think about your needs ahead of time, and how your friends and family members can support you.  Let your loved ones know when they can share advice and when you would just like them to listen.  Give your loved ones information about bipolar disorder, and encourage them to learn about the condition. Finances Not all insurance plans cover mental health care, so it is important to check with your insurance carrier. If paying for co-pays or counseling services is a problem, search for a local or county mental health care center. Public mental health care services may be offered there at a low cost or no cost when you are not able to see a private health care provider. If you are taking medicine for depression, you may be able to get the generic form, which may be less expensive than brand-name medicine. Some makers of prescription medicines also offer help to patients who cannot afford the medicines they need. Follow these instructions at home: Medicines  Take over-the-counter and  prescription medicines only as told by your health care provider or pharmacist.  Ask your pharmacist what over-the-counter cold medicines you should avoid. Some medicines can make symptoms worse. General instructions  Ask for support from trusted family members or friends to make sure you stay on track with your treatment.  Keep a journal to write down your daily moods, medicines, sleep habits, and life events. This may help you have more success with your treatment.  Make and follow a routine for daily meal times. Eat healthy foods, such as whole grains, vegetables, and fresh fruit.  Try to go to sleep and wake up around the same time every day.  Keep all follow-up visits as told by your health care provider. This is important. Questions to ask your health care provider:  If you are taking medicines: ? How long do I need to take medicine? ? Are there any long-term side effects of my medicine? ? Are there any alternatives to taking medicine?  How would I benefit from therapy?  How often should I follow up with a health care provider? Contact a health care provider if:  Your symptoms  get worse or they do not get better with treatment. Get help right away if:  You have thoughts about harming yourself or others. If you ever feel like you may hurt yourself or others, or have thoughts about taking your own life, get help right away. You can go to your nearest emergency department or call:  Your local emergency services (911 in the [place]  A suicide crisis helpline, such as the National Suicide Prevention Lifeline at (905) 249-0300. This is open 24-hours a day. Summary  Learning ways to deal with stress can help to calm you and may also help your treatment work better.  There is a wide range of medicines that can help to treat bipolar disorder.  Having healthy relationships can help to make your moods more stable.  Contact a health care provider if your symptoms get worse or they  do not get better with treatment. This information is not intended to replace advice given to you by your health care provider. Make sure you discuss any questions you have with your health care provider. Document Released: 03/20/2017 Document Revised: 03/20/2017 Document Reviewed: 03/20/2017 Elsevier Interactive Patient Education  2019 [employer].

## 2019-02-18 NOTE — Progress Notes (Signed)
Established Patient Office Visit  Subjective:  Patient ID: Larry Anderson, male    DOB: 13-Jan-1986  Age: 33 y.o. MRN: [id number]  CC:  Chief Complaint  Patient presents with  . Depression    score 24  . Anxiety    score 20    HPI [name] presents for   He was diagnosed by [facility] with Bipolar I. He states that he was going to Psychiatry with [facility]. He states that he is now on Lamotrigine 174m and Venlafaxine 728mtwice a day. He states that he gets manic episodes about once a week. He states that he snaps and gets very irritable and loses control of his emotions and gets angry and cries at the same time.  He gets panic attacks all the time He states that he is now living at home with his in-laws and has lots his job and insurance He is not drinking alcohol, cocaine, heroin He is smoking cigars He drinks a monster energy a few times a week  He gets some suicidal thoughts depending on how low he is feeling He sometimes feels like the "people in my life would be better off if I was dead sometimes I feel this way". He takes things day by day  He would like to talk to a counselor but does not know how to get one   GAD 7 : Generalized Anxiety Score 02/18/2019 10/23/2018 10/22/2017  Nervous, Anxious, on Edge 3 3 1   Control/stop worrying 3 3 3   Worry too much - different things 3 3 3   Trouble relaxing 3 3 3   Restless 2 1 1   Easily annoyed or irritable 3 2 3   Afraid - awful might happen 3 3 1   Total GAD 7 Score 20 18 15   Anxiety Difficulty Extremely difficult Extremely difficult Somewhat difficult    [facility] 02/18/2019 10/23/2018 06/09/2018 11/27/2017 10/22/2017  Decreased Interest 3 3 2 1 3   Down, Depressed, Hopeless 3 3 2 1 2   PHQ - 2 Score 6 6 4 2 5   Altered sleeping 3 3 2 1 3   Tired, decreased energy 3 3 2 1 3   Change in appetite 3 3 2  0 0  Feeling bad or failure about yourself  3 3 2 1 1   Trouble concentrating 3 2 0 1 3   Moving slowly or fidgety/restless 2 3 0 0 1  Suicidal thoughts 1 0 0 0 0  PHQ-9 Score 24 23 12 6 16   Difficult doing work/chores Extremely dIfficult Extremely dIfficult - - Somewhat difficult     Past Medical History:  Diagnosis Date  . Anxiety   . Depression     Past Surgical History:  Procedure Laterality Date  . HAND SURGERY    . HAND SURGERY Right    age 89  History reviewed. No pertinent family history.  Social History   Socioeconomic History  . Marital status: Married    Spouse name: Not on file  . Number of children: 2  . Years of education: Not on file  . Highest education level: Not on file  Occupational History  . Not on file  Social Needs  . Financial resource strain: Not on file  . Food insecurity:    Worry: Not on file    Inability: Not on file  . Transportation needs:    Medical: Not on file    Non-medical: Not on file  Tobacco Use  . Smoking status: Former Smoker  Packs/day: 0.25    Types: Cigars  . Smokeless tobacco: Never Used  Substance and Sexual Activity  . Alcohol use: No  . Drug use: No  . Sexual activity: Yes  Lifestyle  . Physical activity:    Days per week: Not on file    Minutes per session: Not on file  . Stress: Not on file  Relationships  . Social connections:    Talks on phone: Not on file    Gets together: Not on file    Attends religious service: Not on file    Active member of club or organization: Not on file    Attends meetings of clubs or organizations: Not on file    Relationship status: Not on file  . Intimate partner violence:    Fear of current or ex partner: Not on file    Emotionally abused: Not on file    Physically abused: Not on file    Forced sexual activity: Not on file  Other Topics Concern  . Not on file  Social History Narrative  . Not on file    Outpatient Medications Prior to Visit  Medication Sig Dispense Refill  . lamoTRIgine (LAMICTAL) 100 MG tablet TK 1 T PO HS    . busPIRone  (BUSPAR) 5 MG tablet Take 1 tablet (5 mg total) by mouth 3 (three) times daily. (Patient not taking: Reported on 02/18/2019) 90 tablet 0  . diclofenac sodium (VOLTAREN) 1 % GEL Apply 2 g topically 4 (four) times daily. (Patient not taking: Reported on 02/18/2019) 100 g 0  . hydrOXYzine (ATARAX/VISTARIL) 50 MG tablet Take 1 tablet (50 mg total) by mouth at bedtime as needed. (Patient not taking: Reported on 02/18/2019) 30 tablet 11  . venlafaxine XR (EFFEXOR-XR) 75 MG 24 hr capsule Take 1 capsule (75 mg total) by mouth 2 (two) times daily for 30 days. 60 capsule 0   No facility-administered medications prior to visit.     No Known Allergies  ROS Review of Systems Review of Systems  Constitutional: Negative for activity change, appetite change, chills and fever.  HENT: Negative for congestion, nosebleeds, trouble swallowing and voice change.   Respiratory: Negative for cough, shortness of breath and wheezing.   Gastrointestinal: Negative for diarrhea, nausea and vomiting.  Genitourinary: Negative for difficulty urinating, dysuria, flank pain and hematuria.  Musculoskeletal: Negative for back pain, joint swelling and neck pain.  Neurological: Negative for dizziness, speech difficulty, light-headedness and numbness.  See HPI. All other review of systems negative.     Objective:    Physical Exam  BP 118/72   Pulse 79   Temp 97.7 F (36.5 C) (Oral)   Resp 18   Ht 5' 8.78" (1.747 m)   Wt 166 lb (75.3 kg)   SpO2 100%   BMI 24.67 kg/m  Wt Readings from Last 3 Encounters:  02/18/19 166 lb (75.3 kg)  10/23/18 153 lb 12.8 oz (69.8 kg)  06/09/18 160 lb (72.6 kg)   Physical Exam  Constitutional: Oriented to person, place, and time. Appears well-developed and well-nourished.  HENT:  Head: Normocephalic and atraumatic.  Eyes: Conjunctivae and EOM are normal.  Cardiovascular: Normal rate, regular rhythm, normal heart sounds and intact distal pulses.  No murmur heard. Pulmonary/Chest:  Effort normal and breath sounds normal. No stridor. No respiratory distress. Has no wheezes.  Neurological: Is alert and oriented to person, place, and time.  Skin: Skin is warm. Capillary refill takes less than 2 seconds.  Psychiatric: Has a normal  mood and affect. Behavior is normal. Judgment and thought content normal.    Health Maintenance Due  Topic Date Due  . HIV Screening  02/13/2001    There are no preventive care reminders to display for this patient.     Assessment & Plan:   Problem List Items Addressed This Visit    None    Visit Diagnoses    Bipolar I disorder ([place])    -  Primary   Relevant Orders   CMP14+EGFR   TSH   Moderately severe depression (HCC)       Relevant Medications   venlafaxine XR (EFFEXOR-XR) 75 MG 24 hr capsule   Panic attack due to exceptional stress       Relevant Medications   venlafaxine XR (EFFEXOR-XR) 75 MG 24 hr capsule     Advised to get counseling with [facility]  Explained that the psychiatrist does not provide counseling just med mgmt Discussed meds and how to cope with stress No active SI/HI   Meds ordered this encounter  Medications  . venlafaxine XR (EFFEXOR-XR) 75 MG 24 hr capsule    Sig: Take 1 capsule (75 mg total) by mouth 2 (two) times daily.    Dispense:  60 capsule    Refill:  11    Follow-up: Return if symptoms worsen or fail to improve.    Forrest Moron, MD

## 2019-02-19 LAB — CMP14+EGFR
ALK PHOS: 79 IU/L (ref 39–117)
ALT: 34 IU/L (ref 0–44)
AST: 34 IU/L (ref 0–40)
Albumin/Globulin Ratio: 1.9 (ref 1.2–2.2)
Albumin: 4.8 g/dL (ref 4.0–5.0)
BILIRUBIN TOTAL: 0.2 mg/dL (ref 0.0–1.2)
BUN/Creatinine Ratio: 8 — ABNORMAL LOW (ref 9–20)
BUN: 9 mg/dL (ref 6–20)
CHLORIDE: 96 mmol/L (ref 96–106)
CO2: 22 mmol/L (ref 20–29)
CREATININE: 1.14 mg/dL (ref 0.76–1.27)
Calcium: 9.6 mg/dL (ref 8.7–10.2)
GFR calc Af Amer: 97 mL/min/{1.73_m2} (ref >59)
GFR calc non Af Amer: 84 mL/min/{1.73_m2} (ref >59)
GLOBULIN, TOTAL: 2.5 g/dL (ref 1.5–4.5)
GLUCOSE: 128 mg/dL — AB (ref 65–99)
Potassium: 4.3 mmol/L (ref 3.5–5.2)
SODIUM: 139 mmol/L (ref 134–144)
Total Protein: 7.3 g/dL (ref 6.0–8.5)

## 2019-02-19 LAB — TSH: TSH: 1 u[IU]/mL (ref 0.450–4.500)

## 2019-02-28 IMAGING — DX DG LUMBAR SPINE COMPLETE 4+V
5 series · 5 of 5 positions shown · non-contrast
Comparison: None.

CLINICAL DATA: Chronic low back pain, no known injury, initial
encounter

EXAM:
LUMBAR SPINE - COMPLETE 4+ VIEW

[l-spine ap]
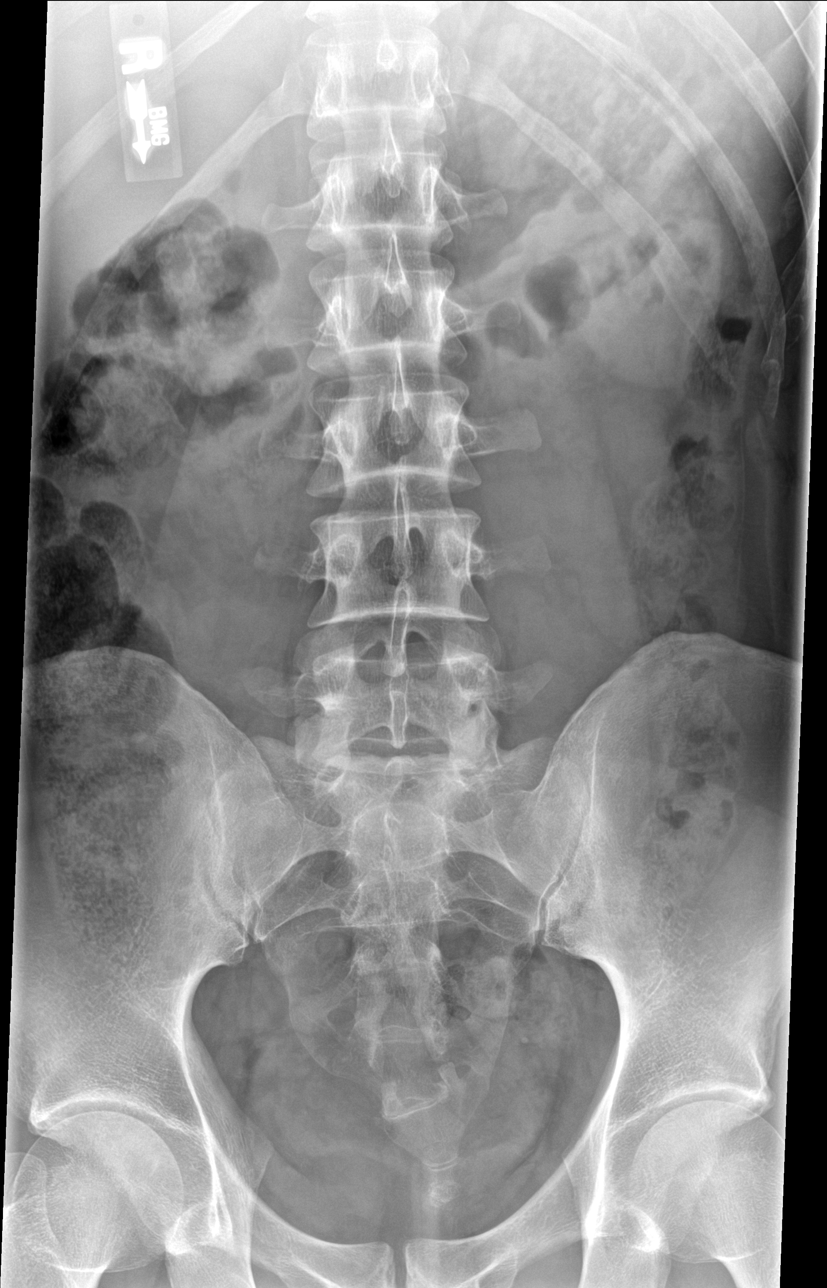

[l-spine obl (1 of 2)]
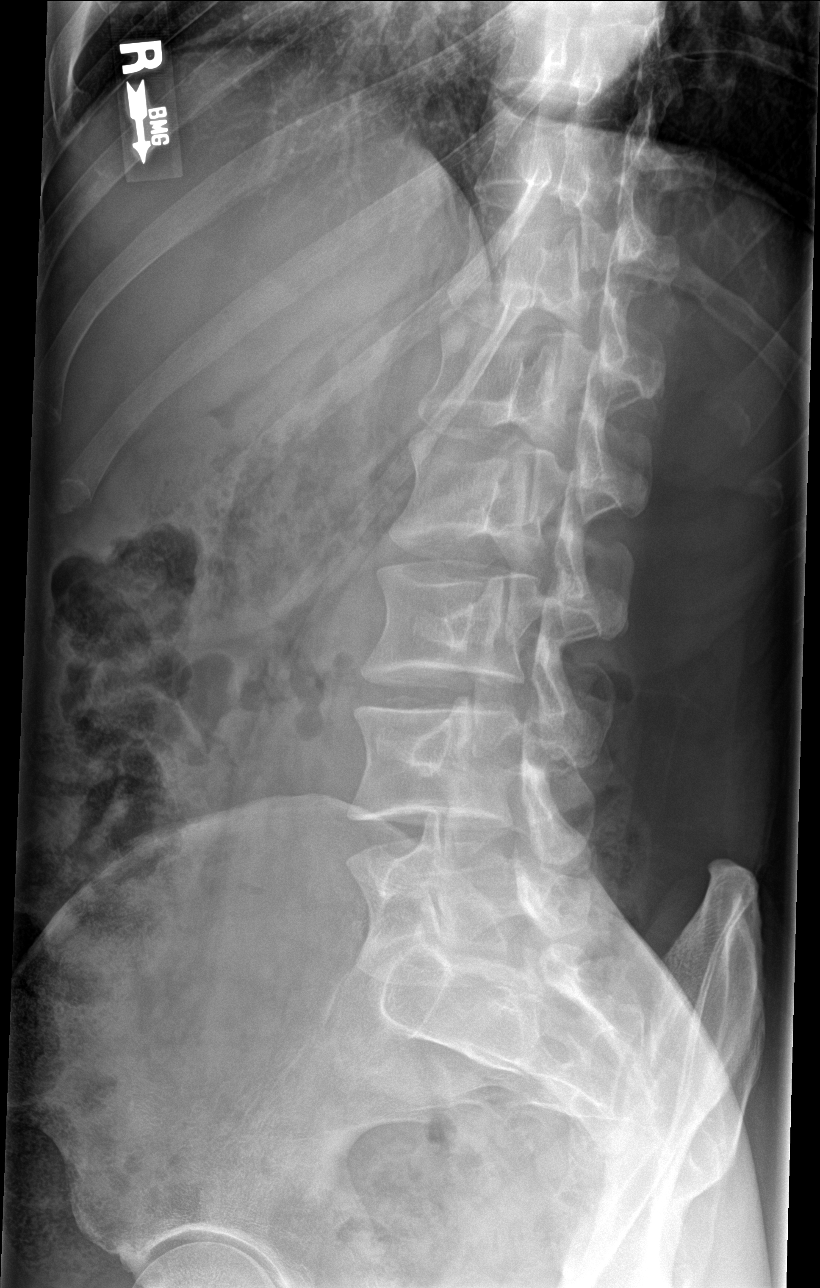

[l-spine obl (2 of 2)]
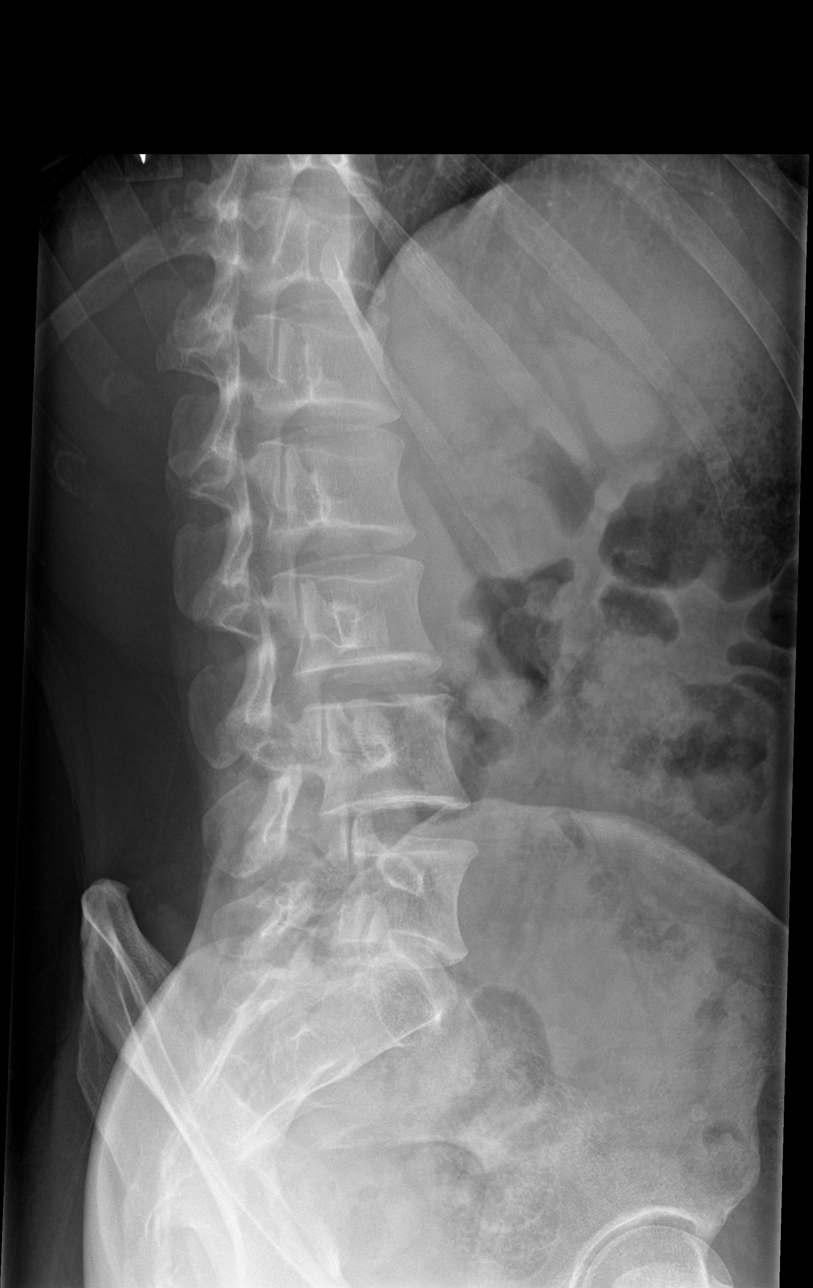

[l-spine lat]
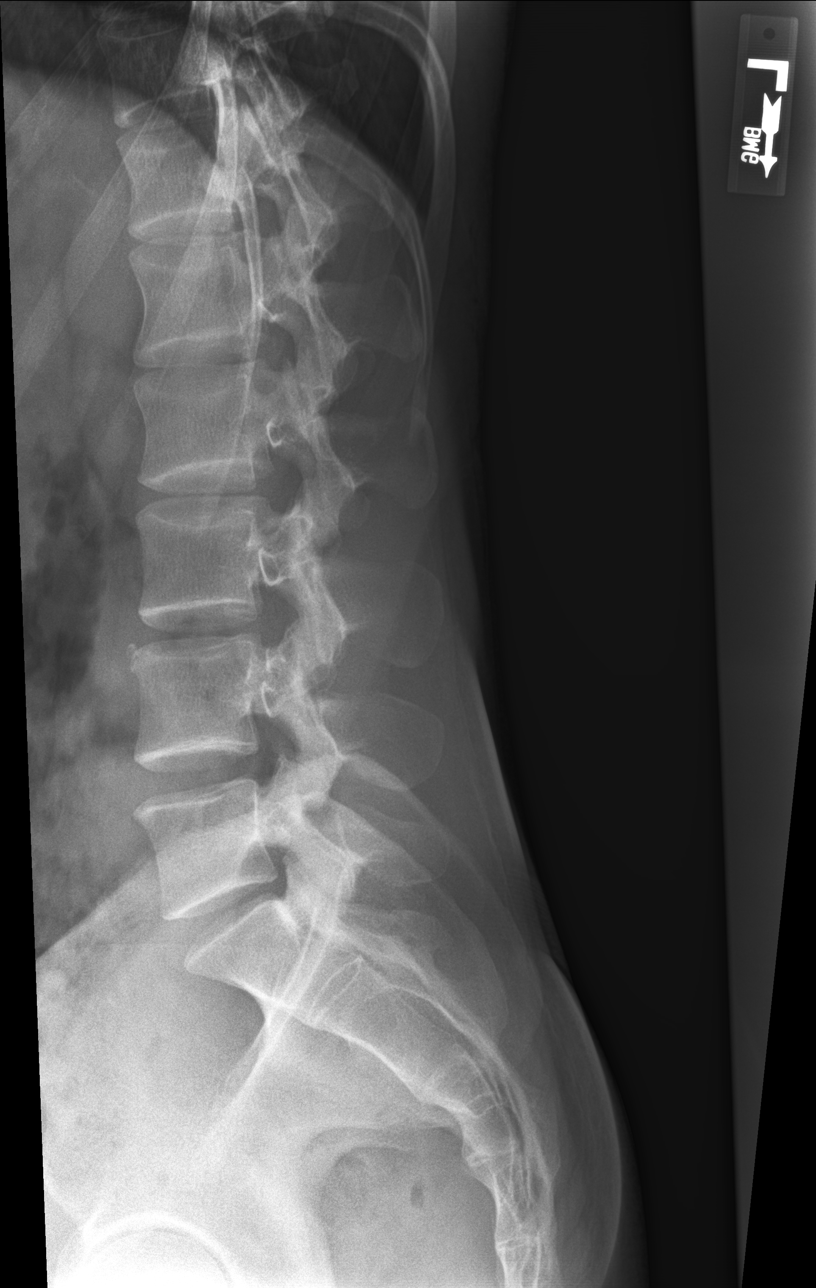

[l-spine l5-s1]
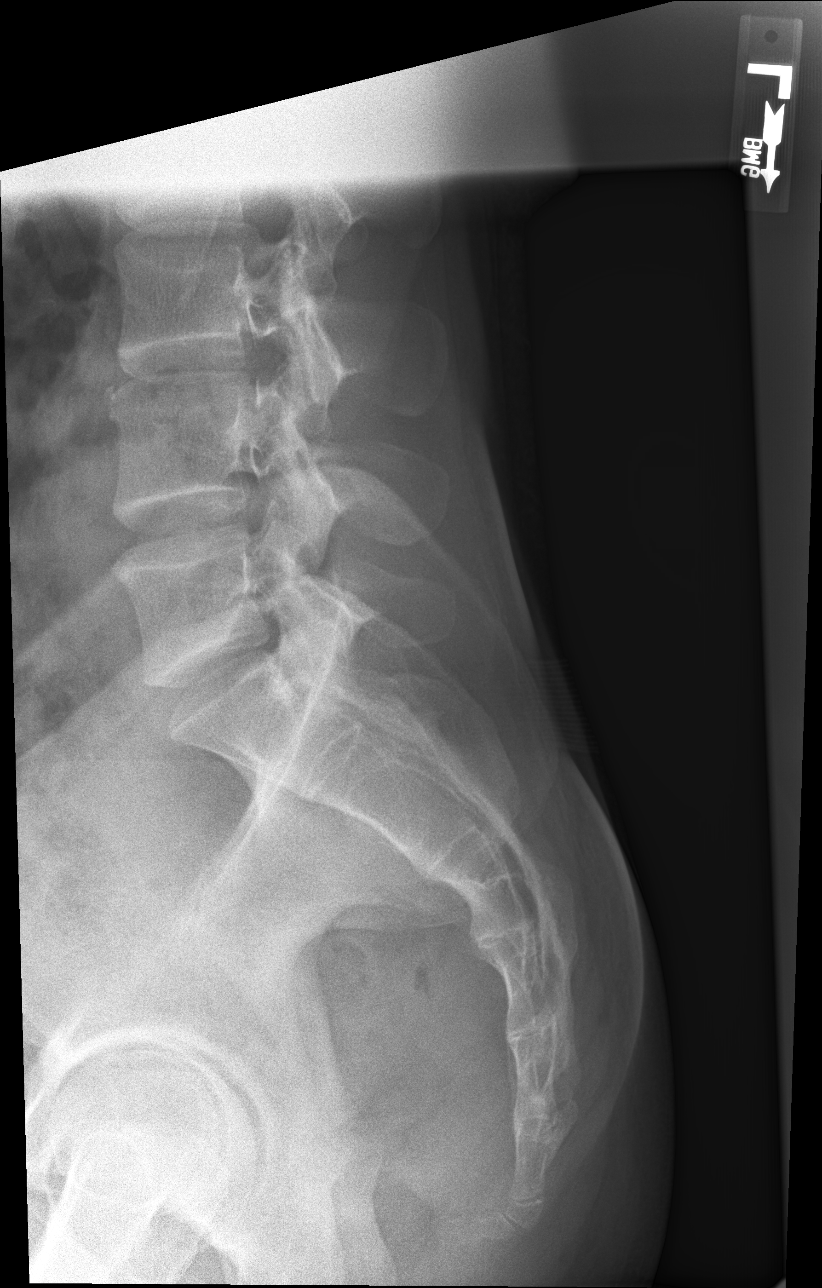

[5 of 5 positions shown; findings below may reference images not displayed]

FINDINGS: Five lumbar type vertebral bodies are well visualized. Vertebral
body height is well maintained. No pars defects are noted. No soft
tissue abnormality is seen.
IMPRESSION: No acute abnormality noted.

## 2020-01-10 ENCOUNTER — Encounter: Payer: Self-pay | Admitting: Family Medicine

## 2020-01-10 ENCOUNTER — Telehealth: Payer: Self-pay

## 2020-01-10 NOTE — Telephone Encounter (Signed)
Please advise   Patient needs medication refill because she just moved, but she has not been seen here in almost a year.

## 2020-01-17 NOTE — Telephone Encounter (Signed)
Please let the patient know that there is the option of a virtual visit for medication refill.

## 2020-02-07 ENCOUNTER — Other Ambulatory Visit: Payer: Self-pay | Admitting: Family Medicine

## 2020-02-07 ENCOUNTER — Encounter: Payer: Self-pay | Admitting: Family Medicine

## 2020-02-07 DIAGNOSIS — F322 Major depressive disorder, single episode, severe without psychotic features: Secondary | ICD-10-CM

## 2020-02-07 DIAGNOSIS — F32A Depression, unspecified: Secondary | ICD-10-CM

## 2020-02-07 NOTE — Telephone Encounter (Signed)
Requested medication (s) are due for refill today: yes  Requested medication (s) are on the active medication list: yes  Last refill:  02/18/2019  Future visit scheduled: no  Notes to clinic:  no valid encounter within last 6 months   Requested Prescriptions  Pending Prescriptions Disp Refills   venlafaxine XR (EFFEXOR-XR) 75 MG 24 hr capsule 60 capsule 11    Sig: Take 1 capsule (75 mg total) by mouth 2 (two) times daily.      Psychiatry: Antidepressants - SNRI - desvenlafaxine & venlafaxine Failed - 02/07/2020 10:34 AM      Failed - LDL in normal range and within 360 days    No results found for: LDLCALC, LDLC, HIRISKLDL, POCLDL, LDLDIRECT, REALLDLC, TOTLDLC        Failed - Total Cholesterol in normal range and within 360 days    No results found for: CHOL, POCCHOL, CHOLTOT        Failed - Triglycerides in normal range and within 360 days    No results found for: TRIG, POCTRIG        Failed - Valid encounter within last 6 months    Recent Outpatient Visits           11 months ago Bipolar I disorder (HCC)   Primary Care at [facility], Manus Rudd, MD   1 year ago Anxiety and depression   Primary Care at [facility], [place], MD   1 year ago Anxiety and depression   Primary Care at [facility], [place] A, MD   2 years ago Adjustment disorder with mixed anxiety and depressed mood   Primary Care at [facility], [place] A, MD   2 years ago Adjustment disorder with mixed anxiety and depressed mood   Primary Care at [facility], [place] A, MD              Passed - Last BP in normal range    BP Readings from Last 1 Encounters:  02/18/19 118/72

## 2020-02-07 NOTE — Telephone Encounter (Signed)
Medication Refill - Medication:  venlafaxine XR (EFFEXOR-XR) 75 MG 24 hr capsule  Has the patient contacted their pharmacy? Yes advised to call office. Pt requested over a month ago with no response and is going through withdrawal.   Preferred Pharmacy (with phone number or street name):  [facility] [address] [place], [place] [address]  Agent: Please be advised that RX refills may take up to 3 business days. We ask that you follow-up with your pharmacy.

## 2020-02-07 NOTE — Telephone Encounter (Signed)
Patient is requesting a refill of the following medications: Requested Prescriptions   Pending Prescriptions Disp Refills  . venlafaxine XR (EFFEXOR-XR) 75 MG 24 hr capsule 60 capsule 11    Sig: Take 1 capsule (75 mg total) by mouth 2 (two) times daily.    Requested medication (s) are due for refill today: yes  Requested medication (s) are on the active medication list: yes  Last refill:  02/18/2019  Future visit scheduled: no  Notes to clinic:  no valid encounter within last 6 months

## 2020-02-08 MED ORDER — VENLAFAXINE HCL ER 75 MG PO CP24: 75.0000 mg | ORAL_CAPSULE | Freq: Two times a day (BID) | ORAL | 11 refills | Status: DC

## 2021-02-22 ENCOUNTER — Telehealth: Payer: Self-pay

## 2021-02-22 NOTE — Telephone Encounter (Signed)
Patient moved 2-3 hours away about 1 year ago and was given refills for a year's supply of  venlafaxine XR (EFFEXOR-XR) 75 MG 24 hr capsule [[id number]]    Patient found out today that his pharmacy won't honor the current refill without a call from the provider. Patient took last pill today and is trying to avoid a lapse in doses.   Patient stated he will look for another provider in his area.   Pharmacy:   [facility] [address], [place] - [address]  [address], [place] [address]  Phone:  [phone] Fax:  [phone]   Please advise patient when Rx has been called in at [phone]

## 2021-02-22 NOTE — Telephone Encounter (Signed)
Patient has been schedule a virtural with Larry Anderson 02/23/21 top get med refill until he can get in with a new provider

## 2021-02-23 ENCOUNTER — Encounter: Payer: Self-pay | Admitting: Registered Nurse

## 2021-02-23 ENCOUNTER — Other Ambulatory Visit: Payer: Self-pay

## 2021-02-23 ENCOUNTER — Telehealth (INDEPENDENT_AMBULATORY_CARE_PROVIDER_SITE_OTHER): Payer: Medicaid Other | Admitting: Registered Nurse

## 2021-02-23 DIAGNOSIS — F322 Major depressive disorder, single episode, severe without psychotic features: Secondary | ICD-10-CM | POA: Diagnosis not present

## 2021-02-23 DIAGNOSIS — F32A Depression, unspecified: Secondary | ICD-10-CM

## 2021-02-23 MED ORDER — VENLAFAXINE HCL ER 37.5 MG PO CP24: 37.5000 mg | ORAL_CAPSULE | Freq: Every day | ORAL | 0 refills | Status: DC

## 2021-02-23 MED ORDER — VENLAFAXINE HCL ER 75 MG PO CP24: 75.0000 mg | ORAL_CAPSULE | Freq: Every day | ORAL | 11 refills | Status: AC

## 2021-02-23 NOTE — Patient Instructions (Signed)
° ° ° °  If you have lab work done today you will be contacted with your lab results within the next 2 weeks.  If you have not heard from us then please contact us. The fastest way to get your results is to register for My Chart. ° ° °IF you received an x-ray today, you will receive an invoice from [facility]. Please contact [facility] at [phone] with questions or concerns regarding your invoice.  ° °IF you received labwork today, you will receive an invoice from [facility]. Please contact [facility] at [phone] with questions or concerns regarding your invoice.  ° °Our billing staff will not be able to assist you with questions regarding bills from these companies. ° °You will be contacted with the lab results as soon as they are available. The fastest way to get your results is to activate your My Chart account. Instructions are located on the last page of this paperwork. If you have not heard from us regarding the results in 2 weeks, please contact this office. °  ° ° ° °

## 2021-04-18 ENCOUNTER — Other Ambulatory Visit: Payer: Self-pay | Admitting: Registered Nurse

## 2021-04-18 DIAGNOSIS — F32A Depression, unspecified: Secondary | ICD-10-CM

## 2021-04-18 DIAGNOSIS — F322 Major depressive disorder, single episode, severe without psychotic features: Secondary | ICD-10-CM

## 2021-10-02 NOTE — Progress Notes (Signed)
Established Patient Office Visit  Subjective:  Patient ID: Larry Anderson, male    DOB: 1986/07/18  Age: 35 y.o. MRN: [id number]  CC:  Chief Complaint  Patient presents with   Medication Refill    Patient states he needs a medication refill. Patient states he has no other concerns.    HPI [name] presents for medication refill  Anxiety and depression On effexor 75mg  ER po qd in the past.  Good effect, no AE. Wants to restart.  Had run out and had not restarted in a few months. Denies hi/si  Past Medical History:  Diagnosis Date   Anxiety    Depression     Past Surgical History:  Procedure Laterality Date   HAND SURGERY     HAND SURGERY Right    age 43    History reviewed. No pertinent family history.  Social History   Socioeconomic History   Marital status: Married    Spouse name: Not on file   Number of children: 2   Years of education: Not on file   Highest education level: Not on file  Occupational History   Not on file  Tobacco Use   Smoking status: Former    Packs/day: 0.25    Types: Cigars, Cigarettes   Smokeless tobacco: Never  Vaping Use   Vaping Use: Some days  Substance and Sexual Activity   Alcohol use: No   Drug use: No   Sexual activity: Yes  Other Topics Concern   Not on file  Social History Narrative   Not on file   Social Determinants of Health   Financial Resource Strain: Not on file  Food Insecurity: Not on file  Transportation Needs: Not on file  Physical Activity: Not on file  Stress: Not on file  Social Connections: Not on file  Intimate Partner Violence: Not on file    Outpatient Medications Prior to Visit  Medication Sig Dispense Refill   venlafaxine XR (EFFEXOR-XR) 75 MG 24 hr capsule Take 1 capsule (75 mg total) by mouth 2 (two) times daily. 60 capsule 11   lamoTRIgine (LAMICTAL) 100 MG tablet TK 1 T PO HS     No facility-administered medications prior to visit.    No Known Allergies  ROS Review of  Systems  Constitutional: Negative.   HENT: Negative.    Eyes: Negative.   Respiratory: Negative.    Cardiovascular: Negative.   Gastrointestinal: Negative.   Genitourinary: Negative.   Musculoskeletal: Negative.   Skin: Negative.   Neurological: Negative.   Psychiatric/Behavioral: Negative.    All other systems reviewed and are negative.    Objective:    Physical Exam Constitutional:      General: He is not in acute distress.    Appearance: Normal appearance. He is normal weight. He is not ill-appearing, toxic-appearing or diaphoretic.  Cardiovascular:     Rate and Rhythm: Normal rate and regular rhythm.     Heart sounds: Normal heart sounds. No murmur heard.   No friction rub. No gallop.  Pulmonary:     Effort: Pulmonary effort is normal. No respiratory distress.     Breath sounds: Normal breath sounds. No stridor. No wheezing, rhonchi or rales.  Chest:     Chest wall: No tenderness.  Neurological:     General: No focal deficit present.     Mental Status: He is alert and oriented to person, place, and time. Mental status is at baseline.  Psychiatric:  Mood and Affect: Mood normal.        Behavior: Behavior normal.        Thought Content: Thought content normal.        Judgment: Judgment normal.    Ht 5\' 9"  (1.753 m)   Wt 158 lb (71.7 kg) Comment: pER PATIENT  BMI 23.33 kg/m  Wt Readings from Last 3 Encounters:  02/23/21 158 lb (71.7 kg)  02/18/19 166 lb (75.3 kg)  10/23/18 153 lb 12.8 oz (69.8 kg)     Health Maintenance Due  Topic Date Due   COVID-19 Vaccine (1) Never done   INFLUENZA VACCINE  Never done    There are no preventive care reminders to display for this patient.  Lab Results  Component Value Date   TSH 1.000 02/18/2019   No results found for: WBC, HGB, HCT, MCV, PLT Lab Results  Component Value Date   NA 139 02/18/2019   K 4.3 02/18/2019   CO2 22 02/18/2019   GLUCOSE 128 (H) 02/18/2019   BUN 9 02/18/2019   CREATININE 1.14  02/18/2019   BILITOT 0.2 02/18/2019   ALKPHOS 79 02/18/2019   AST 34 02/18/2019   ALT 34 02/18/2019   PROT 7.3 02/18/2019   ALBUMIN 4.8 02/18/2019   CALCIUM 9.6 02/18/2019   No results found for: CHOL No results found for: HDL No results found for: LDLCALC No results found for: TRIG No results found for: CHOLHDL No results found for: 02/20/2019    Assessment & Plan:   Problem List Items Addressed This Visit   None Visit Diagnoses     Moderately severe depression (HCC)       Relevant Medications   venlafaxine XR (EFFEXOR-XR) 75 MG 24 hr capsule       Meds ordered this encounter  Medications   venlafaxine XR (EFFEXOR-XR) 75 MG 24 hr capsule    Sig: Take 1 capsule (75 mg total) by mouth daily with breakfast.    Dispense:  30 capsule    Refill:  11    Order Specific Question:   Supervising Provider    Answer:   [id number], JEFFREY R [[id number]]   DISCONTD: venlafaxine XR (EFFEXOR XR) 37.5 MG 24 hr capsule    Sig: Take 1 capsule (37.5 mg total) by mouth daily with breakfast.    Dispense:  30 capsule    Refill:  0    Order Specific Question:   Supervising Provider    Answer:   Neva Seat, JEFFREY R [[id number]]    Follow-up: No follow-ups on file.   PLAN Restart effexor at 37.5mg  XR po qd then increase to 75mg  ER po qd after 2-3 weeks if tolerating well. Med check in 6-8 weeks if subtherapeutic effect. Otherwise follow up within 1 year. Patient encouraged to call clinic with any questions, comments, or concerns.  Neva Seat, NP
